# Patient Record
Sex: Female | Born: 1975 | Race: Black or African American | Hispanic: No | Marital: Single | Smoking: Never smoker
Health system: Southern US, Community
[De-identification: ages and names within clinical notes are randomized; demographics above are authoritative.]

## PROBLEM LIST (undated history)

## (undated) DIAGNOSIS — I10 Essential (primary) hypertension: Secondary | ICD-10-CM

---

## 2001-11-04 ENCOUNTER — Inpatient Hospital Stay (HOSPITAL_COMMUNITY): Admission: AD | Admit: 2001-11-04 | Discharge: 2001-11-04 | Payer: Self-pay | Admitting: Obstetrics

## 2002-01-17 ENCOUNTER — Inpatient Hospital Stay (HOSPITAL_COMMUNITY): Admission: AD | Admit: 2002-01-17 | Discharge: 2002-01-17 | Payer: Self-pay | Admitting: Obstetrics

## 2002-01-29 ENCOUNTER — Encounter: Payer: Self-pay | Admitting: Obstetrics

## 2002-01-29 ENCOUNTER — Inpatient Hospital Stay (HOSPITAL_COMMUNITY): Admission: AD | Admit: 2002-01-29 | Discharge: 2002-02-02 | Payer: Self-pay | Admitting: Obstetrics

## 2002-01-31 ENCOUNTER — Encounter (INDEPENDENT_AMBULATORY_CARE_PROVIDER_SITE_OTHER): Payer: Self-pay | Admitting: *Deleted

## 2008-07-27 ENCOUNTER — Ambulatory Visit (HOSPITAL_COMMUNITY): Admission: AD | Admit: 2008-07-27 | Discharge: 2008-07-27 | Payer: Self-pay | Admitting: Obstetrics & Gynecology

## 2008-07-27 ENCOUNTER — Ambulatory Visit: Payer: Self-pay | Admitting: Obstetrics & Gynecology

## 2008-07-27 ENCOUNTER — Encounter: Payer: Self-pay | Admitting: Obstetrics & Gynecology

## 2009-07-15 ENCOUNTER — Emergency Department (HOSPITAL_COMMUNITY): Admission: EM | Admit: 2009-07-15 | Discharge: 2009-07-15 | Payer: Self-pay | Admitting: Emergency Medicine

## 2010-07-30 LAB — CROSSMATCH
ABO/RH(D): O POS
Antibody Screen: NEGATIVE

## 2010-07-30 LAB — COMPREHENSIVE METABOLIC PANEL
ALT: 16 U/L (ref 0–35)
AST: 22 U/L (ref 0–37)
Albumin: 3.3 g/dL — ABNORMAL LOW (ref 3.5–5.2)
Alkaline Phosphatase: 69 U/L (ref 39–117)
BUN: 11 mg/dL (ref 6–23)
CO2: 21 mEq/L (ref 19–32)
Calcium: 8.1 mg/dL — ABNORMAL LOW (ref 8.4–10.5)
Chloride: 106 mEq/L (ref 96–112)
Creatinine, Ser: 0.76 mg/dL (ref 0.4–1.2)
GFR calc Af Amer: >60 mL/min (ref >60)
GFR calc non Af Amer: >60 mL/min (ref >60)
Glucose, Bld: 110 mg/dL — ABNORMAL HIGH (ref 70–99)
Potassium: 3.7 mEq/L (ref 3.5–5.1)
Sodium: 136 mEq/L (ref 135–145)
Total Bilirubin: 0.3 mg/dL (ref 0.3–1.2)
Total Protein: 6 g/dL (ref 6.0–8.3)

## 2010-07-30 LAB — CBC
HCT: 25.1 % — ABNORMAL LOW (ref 36.0–46.0)
HCT: 30.7 % — ABNORMAL LOW (ref 36.0–46.0)
Hemoglobin: 10.3 g/dL — ABNORMAL LOW (ref 12.0–15.0)
MCHC: 33.4 g/dL (ref 30.0–36.0)
MCV: 80.8 fL (ref 78.0–100.0)
Platelets: 204 10*3/uL (ref 150–400)
Platelets: 227 10*3/uL (ref 150–400)
RBC: 3.8 MIL/uL — ABNORMAL LOW (ref 3.87–5.11)
RDW: 15.6 % — ABNORMAL HIGH (ref 11.5–15.5)
RDW: 15.6 % — ABNORMAL HIGH (ref 11.5–15.5)
WBC: 11.5 10*3/uL — ABNORMAL HIGH (ref 4.0–10.5)

## 2010-07-30 LAB — DIFFERENTIAL
Basophils Absolute: 0 10*3/uL (ref 0.0–0.1)
Basophils Relative: 0 % (ref 0–1)
Eosinophils Absolute: 0.1 10*3/uL (ref 0.0–0.7)
Eosinophils Relative: 0 % (ref 0–5)
Lymphocytes Relative: 9 % — ABNORMAL LOW (ref 12–46)
Lymphs Abs: 1 10*3/uL (ref 0.7–4.0)
Monocytes Absolute: 0.4 10*3/uL (ref 0.1–1.0)
Monocytes Relative: 3 % (ref 3–12)
Neutro Abs: 10 10*3/uL — ABNORMAL HIGH (ref 1.7–7.7)
Neutrophils Relative %: 87 % — ABNORMAL HIGH (ref 43–77)

## 2010-07-30 LAB — ABO/RH
ABO/RH(D): O POS
ABO/RH(D): O POS

## 2010-08-21 ENCOUNTER — Other Ambulatory Visit (HOSPITAL_COMMUNITY): Payer: Self-pay | Admitting: Internal Medicine

## 2010-08-21 ENCOUNTER — Ambulatory Visit (HOSPITAL_COMMUNITY)
Admission: RE | Admit: 2010-08-21 | Discharge: 2010-08-21 | Disposition: A | Discharge status: Home / Self Care | Payer: Medicaid Other | Source: Ambulatory Visit | Attending: Internal Medicine | Admitting: Internal Medicine

## 2010-08-21 DIAGNOSIS — M25569 Pain in unspecified knee: Secondary | ICD-10-CM | POA: Insufficient documentation

## 2010-08-21 DIAGNOSIS — R52 Pain, unspecified: Secondary | ICD-10-CM

## 2010-09-02 NOTE — Op Note (Signed)
[name], [name] NO.:  [id number]   MEDICAL RECORD NO.:  [id number]          PATIENT TYPE:  AMB   LOCATION:  SDC                           FACILITY:  [facility]   PHYSICIAN:  Norton Blizzard, MD    DATE OF BIRTH:  1975-07-17   DATE OF PROCEDURE:  DATE OF DISCHARGE:                               OPERATIVE REPORT   PREOPERATIVE DIAGNOSES:  Intrauterine pregnancy at 5 and 5/7th weeks  gestation, incomplete medical abortion, syncope, heavy bleeding.   POSTOPERATIVE DIAGNOSES:  Intrauterine pregnancy at 5 and 5/7th weeks  gestation, incomplete medical abortion, syncope, heavy bleeding.   PROCEDURE:  Dilation and evacuation for surgical completion of failed  medical abortion.   SURGEON:  Norton Blizzard, MD   ANESTHESIA:  MAC and paracervical block.   IV FLUIDS:  1300 mL of lactated Ringer's.   URINE OUTPUT:  Minimal.   ESTIMATED BLOOD LOSS:  50 mL during the procedure.  Of note, the patient  lost a lot of blood during the examination in the MAU and was  orthostatic.   PROCEDURE INDICATIONS:  The patient is a 35 year old gravida 4, para 3-0-  1-3 status post medical abortion with misoprostol on July 26, 2008.  The  patient had heavy bleeding, was passing clots and continued to have  heavy bleeding when she went to work today and she had one episode of  syncope at her work place.  She was brought to the emergency room by  ambulance, and in the emergency room, the patient was examined and noted  to be orthostatic.  She also was noted to have an open cervical os and  heavy amount bleeding necessitating urgent dilation and evacuation for  her incomplete abortion.  Prior to surgery, the risks including but not  limited to bleeding which might require transfusion,  infection which  might require antibiotics, injury to uterus and surrounding organs, need  for additional procedures were explained to the patient and written  informed consent was obtained.   FINDINGS:   Nine-week sized anteverted uterus, moderate amount of  products of conception.   SPECIMENS:  Products of conception.   DISPOSITION OF SPECIMENS:  Pathology.   COMPLICATIONS:  None immediate.   DESCRIPTION OF PROCEDURE:  The patient was taken from the MAU to the  operating room.  She received Doxycycline 100 mg IV for prophylaxis.  While in the operating room, the patient was placed under monitored  anesthesia care, which was found to be adequate.  She was then placed in  the dorsal lithotomy position, and prepped and draped in a sterile  manner.  Her bladder was catheterized for an unmeasured amount of clear  yellow urine.  Attention was turned to the patient's pelvis where a  vaginal speculum was placed, and a tenaculum was placed on the anterior  lip of the cervix.  A paracervical block using 1% lidocaine and  epinephrine was placed, a total of 20 mL was used.  The cervix was noted  to be about 1 cm dilated and open, so the 8-mm curved curette was easily  passed into the fundus.  The suction machine was activated, suction was  created, and the curette was slowly rotated to clear the contents of the  uterus.  The uterus was noted to contract down after evacuation of  contents, and there was a moderate amount of products of conception  noted.  There was minimal blood loss during surgery, estimated to be  above 50 mL.  Sharp curettage was then done to confirm complete  evacuation of contents.  The tenaculum was then removed from the  anterior lip of the cervix and overall good hemostasis was noted.  All  instruments were removed from the patient's vagina.  The patient  tolerated the procedure well.  Sponge, instrument, and needle counts  were correct x2.  She was taken to the recovery room awake, and in  stable condition.   FOLLOWUP APPOINTMENT:  The patient has been scheduled for a followup  appointment on August 17, 2008, at 9:30 a.m. in the GYN Clinic here at  [facility].   She was given routine post D&E instructions and told  to come to MAU or the nearest emergency room for any postoperative  complications or concerns.      Norton Blizzard, MD  Electronically Signed     UAD/MEDQ  D:  07/27/2008  T:  07/28/2008  Job:  [id number]

## 2013-10-23 ENCOUNTER — Encounter (HOSPITAL_COMMUNITY): Payer: Self-pay | Admitting: Emergency Medicine

## 2013-10-23 ENCOUNTER — Emergency Department (HOSPITAL_COMMUNITY)
Admission: EM | Admit: 2013-10-23 | Discharge: 2013-10-23 | Disposition: A | Discharge status: Home / Self Care | Payer: No Typology Code available for payment source | Attending: Emergency Medicine | Admitting: Emergency Medicine

## 2013-10-23 DIAGNOSIS — Y9389 Activity, other specified: Secondary | ICD-10-CM | POA: Insufficient documentation

## 2013-10-23 DIAGNOSIS — Z79899 Other long term (current) drug therapy: Secondary | ICD-10-CM | POA: Insufficient documentation

## 2013-10-23 DIAGNOSIS — T148XXA Other injury of unspecified body region, initial encounter: Secondary | ICD-10-CM

## 2013-10-23 DIAGNOSIS — S199XXA Unspecified injury of neck, initial encounter: Secondary | ICD-10-CM

## 2013-10-23 DIAGNOSIS — IMO0002 Reserved for concepts with insufficient information to code with codable children: Secondary | ICD-10-CM | POA: Insufficient documentation

## 2013-10-23 DIAGNOSIS — S0993XA Unspecified injury of face, initial encounter: Secondary | ICD-10-CM | POA: Insufficient documentation

## 2013-10-23 DIAGNOSIS — Y92410 Unspecified street and highway as the place of occurrence of the external cause: Secondary | ICD-10-CM | POA: Insufficient documentation

## 2013-10-23 MED ORDER — IBUPROFEN 400 MG PO TABS
800.0000 mg | ORAL_TABLET | Freq: Once | ORAL | Status: AC
  Administered 2013-10-23: 800 mg via ORAL
  Filled 2013-10-23: qty 2

## 2013-10-23 MED ORDER — METHOCARBAMOL 500 MG PO TABS: 1000.0000 mg | ORAL_TABLET | Freq: Four times a day (QID) | ORAL | Status: AC

## 2013-10-23 MED ORDER — NAPROXEN 500 MG PO TABS: 500.0000 mg | ORAL_TABLET | Freq: Two times a day (BID) | ORAL | Status: AC

## 2013-10-23 NOTE — ED Provider Notes (Signed)
Medical screening examination/treatment/procedure(s) were performed by non-physician practitioner and as supervising physician I was immediately available for consultation/collaboration.   EKG Interpretation None        Enid SkeensJoshua M Cherilynn Schomburg, MD 10/23/13 1537
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
## 2016-01-16 ENCOUNTER — Emergency Department (HOSPITAL_COMMUNITY)
Admission: EM | Admit: 2016-01-16 | Discharge: 2016-01-16 | Disposition: A | Discharge status: Home / Self Care | Payer: Medicaid Other | Attending: Emergency Medicine | Admitting: Emergency Medicine

## 2016-01-16 DIAGNOSIS — R002 Palpitations: Secondary | ICD-10-CM | POA: Insufficient documentation

## 2016-01-16 LAB — BASIC METABOLIC PANEL
Anion gap: 8 (ref 5–15)
BUN: 6 mg/dL (ref 6–20)
CALCIUM: 9.2 mg/dL (ref 8.9–10.3)
CO2: 24 mmol/L (ref 22–32)
CREATININE: 0.86 mg/dL (ref 0.44–1.00)
Chloride: 106 mmol/L (ref 101–111)
GFR calc non Af Amer: >60 mL/min (ref >60)
Glucose, Bld: 98 mg/dL (ref 65–99)
Potassium: 3.4 mmol/L — ABNORMAL LOW (ref 3.5–5.1)
Sodium: 138 mmol/L (ref 135–145)

## 2016-01-16 LAB — CBC WITH DIFFERENTIAL/PLATELET
BASOS ABS: 0 10*3/uL (ref 0.0–0.1)
BASOS PCT: 0 %
EOS ABS: 0 10*3/uL (ref 0.0–0.7)
EOS PCT: 1 %
HCT: 39.4 % (ref 36.0–46.0)
HEMOGLOBIN: 13.5 g/dL (ref 12.0–15.0)
Lymphocytes Relative: 16 %
Lymphs Abs: 1 10*3/uL (ref 0.7–4.0)
MCH: 28.5 pg (ref 26.0–34.0)
MCHC: 34.3 g/dL (ref 30.0–36.0)
MCV: 83.3 fL (ref 78.0–100.0)
MONO ABS: 0.3 10*3/uL (ref 0.1–1.0)
MONOS PCT: 6 %
NEUTROS PCT: 77 %
Neutro Abs: 4.7 10*3/uL (ref 1.7–7.7)
Platelets: 251 10*3/uL (ref 150–400)
RBC: 4.73 MIL/uL (ref 3.87–5.11)
RDW: 12.9 % (ref 11.5–15.5)
WBC: 6.1 10*3/uL (ref 4.0–10.5)

## 2016-01-16 MED ORDER — SODIUM CHLORIDE 0.9 % IV BOLUS (SEPSIS)
500.0000 mL | Freq: Once | INTRAVENOUS | Status: AC
  Administered 2016-01-16: 500 mL via INTRAVENOUS

## 2016-01-16 NOTE — ED Notes (Signed)
Pt ambulated to bathroom 

## 2016-01-16 NOTE — ED Triage Notes (Signed)
Pt arrives via POV from home with heart palpitations since yesterday evening. States just started HCG and amphetamines for weightloss. Also noted her blood pressure was high at home. Pt reports very little food and water intake yesterday.

## 2016-01-16 NOTE — ED Notes (Signed)
Pt not in room.

## 2016-01-16 NOTE — Discharge Instructions (Signed)
Read the information below.  Your labs were re-assuring. Your EKG was re-assuring.  I encourage you to follow up with the bariatric office regarding your recent medication changes, these may be contributing to your symptoms.   Be sure to stay well hydrated and eat a balanced diet.  It is important to establish a primary care provider, I have provided the contact information for [facility] as well as additional resources to help with setting up a doctor.  You may return to the Emergency Department at any time for worsening condition or any new symptoms that concern you. Return to ED if you develop chest pain, shortness of breath, dizziness, or lightheadedness.

## 2016-01-16 NOTE — ED Provider Notes (Signed)
[facility]-EMERGENCY DEPT Provider Note   CSN: [id number] Arrival date & time: 01/16/16  0831     History   Chief Complaint Chief Complaint  Patient presents with  . Palpitations    HPI Destiny [name] is a 40 y.o. female.  Destiny MuirLatosha R [name] is a 40 y.o. female with no pertinent medical history presents to the ED with complaint of palpitations. Patient reports she started taking HCG and amphetamines on Tuesday for weight loss as prescribed by bariatric center. Yesterday she reports she only ate a bagel and drank one cup of coffee and a bottle of water. Last night she was checking her blood pressure and noted it to be elevated and she felt "anxious" and experienced "palpitations" described as her "heart racing," she was concerned prompting her to come to the ED. She currently denies feeling palpitations. She does endorse feeling dehydrated stating "my skin feels dry." She denies any fever, chest pain, shortness of breath, changes in vision, dizziness, lightheadedness, headache, numbness, weakness, abdominal pain, N/V.  She has not taken the medications today. No treatments tried PTA.       No past medical history on file.  There are no active problems to display for this patient.   No past surgical history on file.  OB History    No data available       Home Medications    Prior to Admission medications   Medication Sig Start Date End Date Taking? Authorizing Provider  Biotin 5000 MCG TABS Take 5,000 mcg by mouth daily.    Historical Provider, MD  loratadine (CLARITIN) 10 MG tablet Take 10 mg by mouth daily.    Historical Provider, MD  methocarbamol (ROBAXIN) 500 MG tablet Take 2 tablets (1,000 mg total) by mouth 4 (four) times daily. 10/23/13   Renne CriglerJoshua Geiple, PA-C  naproxen (NAPROSYN) 500 MG tablet Take 1 tablet (500 mg total) by mouth 2 (two) times daily. 10/23/13   Renne CriglerJoshua Geiple, PA-C    Family History No family history on file.  Social History Social History  Substance Use  Topics  . Smoking status: Never Smoker  . Smokeless tobacco: Not on file  . Alcohol use Yes     Allergies   Review of patient's allergies indicates no known allergies.   Review of Systems Review of Systems  Constitutional: Negative for chills, diaphoresis and fever.  HENT: Negative for trouble swallowing.   Eyes: Negative for visual disturbance.  Respiratory: Negative for shortness of breath.   Cardiovascular: Positive for palpitations (resolved). Negative for chest pain.  Gastrointestinal: Negative for abdominal pain, blood in stool, constipation, diarrhea, nausea and vomiting.  Genitourinary: Negative for dysuria and hematuria.  Musculoskeletal: Negative for neck pain.  Skin: Negative for rash.  Neurological: Negative for dizziness, syncope, light-headedness and headaches.     Physical Exam Updated Vital Signs BP 135/80 (BP Location: Right Arm)   Pulse 65   Resp 15   LMP 01/12/2016   SpO2 100%   Physical Exam  Constitutional: She appears well-developed and well-nourished. No distress.  HENT:  Head: Normocephalic and atraumatic.  Mouth/Throat: Oropharynx is clear and moist. No oropharyngeal exudate.  Eyes: Conjunctivae and EOM are normal. Pupils are equal, round, and reactive to light. Right eye exhibits no discharge. Left eye exhibits no discharge. No scleral icterus.  Neck: Normal range of motion and phonation normal. Neck supple. No neck rigidity. Normal range of motion present.  Cardiovascular: Normal rate, regular rhythm, normal heart sounds and intact distal pulses.  No murmur heard. Pulmonary/Chest: Effort normal and breath sounds normal. No stridor. No respiratory distress. She has no wheezes. She has no rales.  Abdominal: Soft. Bowel sounds are normal. She exhibits no distension. There is no tenderness. There is no rigidity, no rebound, no guarding and no CVA tenderness.  Musculoskeletal: Normal range of motion.  Lymphadenopathy:    She has no cervical  adenopathy.  Neurological: She is alert. She is not disoriented. Coordination and gait normal. GCS eye subscore is 4. GCS verbal subscore is 5. GCS motor subscore is 6.  Skin: Skin is warm and dry. She is not diaphoretic.  Psychiatric: She has a normal mood and affect. Her behavior is normal.     ED Treatments / Results  Labs (all labs ordered are listed, but only abnormal results are displayed) Labs Reviewed  BASIC METABOLIC PANEL - Abnormal; Notable for the following:       Result Value   Potassium 3.4 (*)    All other components within normal limits  CBC WITH DIFFERENTIAL/PLATELET    EKG  EKG Interpretation  Date/Time:  Thursday January 16 2016 08:37:52 EDT Ventricular Rate:  92 PR Interval:  152 QRS Duration: 86 QT Interval:  360 QTC Calculation: 445 R Axis:   82 Text Interpretation:  Normal sinus rhythm Nonspecific ST abnormality Otherwise normal ECG Confirmed by KNOTT MD, DANIEL ([id number]) on 01/16/2016 10:58:50 AM       Radiology No results found.  Procedures Procedures (including critical care time)  Medications Ordered in ED Medications  sodium chloride 0.9 % bolus 500 mL (0 mLs Intravenous Stopped 01/16/16 1242)     Initial Impression / Assessment and Plan / ED Course  I have reviewed the triage vital signs and the nursing notes.  Pertinent labs & imaging results that were available during my care of the patient were reviewed by me and considered in my medical decision making (see chart for details).  Clinical Course    Patient presents to ED with complaint of palpitations. Patient is afebrile and non-toxic appearing in NAD. VSS. Physical exam is re-assuring. IVF given. EKG shows sinus rhythm, normal intervals, no STEMI. Labs are re-assuring, doubt electrolyte abnormality as cause. No evidence of anemia. Suspect sxs may be related to ?recent new medications vs. ?anxiety vs. ?dehydration secondary to poor intake and new medications. Patient endorses  improvement following IVF hydration. Remains asx. Discussed results and plan with pt. Encouraged adequate hydration and balanced diet. Follow up with primary provider for re-evaluation. Also encouraged pt to call bariatric center regarding new medications and possible need for adjustment. Return precautions discussed. Patient voiced understanding and is agreeable.    Final Clinical Impressions(s) / ED Diagnoses   Final diagnoses:  Palpitations    New Prescriptions Discharge Medication List as of 01/16/2016  1:12 PM       Lona Kettle, PA-C 01/17/16 1610    Lyndal Pulley, MD 01/17/16 1807

## 2016-01-16 NOTE — ED Notes (Signed)
Pt ambulated to restroom. Pt tolerated well.  

## 2016-01-16 NOTE — ED Notes (Signed)
Pt states that she only drank "one regular bottle of water and a cup of coffee yesterday".

## 2019-03-31 ENCOUNTER — Other Ambulatory Visit: Payer: Self-pay

## 2019-03-31 ENCOUNTER — Emergency Department (HOSPITAL_COMMUNITY)
Admission: EM | Admit: 2019-03-31 | Discharge: 2019-04-01 | Disposition: A | Discharge status: Home / Self Care | Payer: Medicaid Other | Attending: Emergency Medicine | Admitting: Emergency Medicine

## 2019-03-31 ENCOUNTER — Encounter (HOSPITAL_COMMUNITY): Payer: Self-pay | Admitting: *Deleted

## 2019-03-31 DIAGNOSIS — Z79899 Other long term (current) drug therapy: Secondary | ICD-10-CM | POA: Diagnosis not present

## 2019-03-31 DIAGNOSIS — I10 Essential (primary) hypertension: Secondary | ICD-10-CM | POA: Diagnosis present

## 2019-03-31 HISTORY — DX: Essential (primary) hypertension: I10

## 2019-03-31 LAB — URINALYSIS, ROUTINE W REFLEX MICROSCOPIC
Bacteria, UA: NONE SEEN
Bilirubin Urine: NEGATIVE
Glucose, UA: NEGATIVE mg/dL
Ketones, ur: NEGATIVE mg/dL
Leukocytes,Ua: NEGATIVE
Nitrite: NEGATIVE
Protein, ur: NEGATIVE mg/dL
Specific Gravity, Urine: 1.008 (ref 1.005–1.030)
pH: 6 (ref 5.0–8.0)

## 2019-03-31 LAB — BASIC METABOLIC PANEL
Anion gap: 11 (ref 5–15)
BUN: 13 mg/dL (ref 6–20)
CO2: 23 mmol/L (ref 22–32)
Calcium: 8.9 mg/dL (ref 8.9–10.3)
Chloride: 105 mmol/L (ref 98–111)
Creatinine, Ser: 0.88 mg/dL (ref 0.44–1.00)
GFR calc Af Amer: >60 mL/min (ref >60)
GFR calc non Af Amer: >60 mL/min (ref >60)
Glucose, Bld: 118 mg/dL — ABNORMAL HIGH (ref 70–99)
Potassium: 3.4 mmol/L — ABNORMAL LOW (ref 3.5–5.1)
Sodium: 139 mmol/L (ref 135–145)

## 2019-03-31 LAB — CBC
HCT: 39.5 % (ref 36.0–46.0)
Hemoglobin: 13.3 g/dL (ref 12.0–15.0)
MCH: 29 pg (ref 26.0–34.0)
MCHC: 33.7 g/dL (ref 30.0–36.0)
MCV: 86.1 fL (ref 80.0–100.0)
Platelets: 319 10*3/uL (ref 150–400)
RBC: 4.59 MIL/uL (ref 3.87–5.11)
RDW: 13.1 % (ref 11.5–15.5)
WBC: 10.7 10*3/uL — ABNORMAL HIGH (ref 4.0–10.5)
nRBC: 0 % (ref 0.0–0.2)

## 2019-03-31 MED ORDER — SODIUM CHLORIDE 0.9% FLUSH: 3.0000 mL | Freq: Once | INTRAVENOUS | Status: DC

## 2019-03-31 NOTE — ED Triage Notes (Signed)
Pt says that she checked her blood pressure and it was 180/110, she denies any symptoms. States that she started taking Hydrochlorothiazide today. Pt states she feels like she is dehydrated, not drinking as much as normal.

## 2019-04-01 NOTE — ED Provider Notes (Signed)
TIME SEEN: 2:30 AM  CHIEF COMPLAINT: Hypertension  HPI: Patient is a 43 year old female with recent diagnosis of hypertension who presents to the emergency department with elevated blood pressure.  States she went to [facility] today for a routine checkup and they noted her blood pressure was elevated.  They started her on hydrochlorothiazide 12.5 mg daily.  She took 1 dose today.  States after she left the clinic she was checking her blood pressure regularly with a wrist cuff and states "I became paranoid" and wanted to come to the ED to get "checked out".  She denies any severe headache, vision changes, numbness, tingling, weakness, chest discomfort or tightness or pressure, shortness of breath.  States that she has not been seen by a doctor in years and is not sure what her blood pressure normally runs.  ROS: See HPI Constitutional: no fever  Eyes: no drainage  ENT: no runny nose   Cardiovascular:  no chest pain  Resp: no SOB  GI: no vomiting GU: no dysuria Integumentary: no rash  Allergy: no hives  Musculoskeletal: no leg swelling  Neurological: no slurred speech ROS otherwise negative  PAST MEDICAL HISTORY/PAST SURGICAL HISTORY:  Past Medical History:  Diagnosis Date  . Hypertension     MEDICATIONS:  Prior to Admission medications   Medication Sig Start Date End Date Taking? Authorizing Provider  Biotin 5000 MCG TABS Take 5,000 mcg by mouth daily.    [provider]  loratadine (CLARITIN) 10 MG tablet Take 10 mg by mouth daily.    [provider]  methocarbamol (ROBAXIN) 500 MG tablet Take 2 tablets (1,000 mg total) by mouth 4 (four) times daily. 10/23/13   Renne Crigler, PA-C  naproxen (NAPROSYN) 500 MG tablet Take 1 tablet (500 mg total) by mouth 2 (two) times daily. 10/23/13   Renne Crigler, PA-C    ALLERGIES:  No Known Allergies  SOCIAL HISTORY:  Social History   Tobacco Use  . Smoking status: Never Smoker  Substance Use Topics  . Alcohol  use: Yes    FAMILY HISTORY: No family history on file.  EXAM: BP (!) 178/94 (BP Location: Right Arm) Comment (BP Location): forearm  Pulse 83   Temp 98.4 F (36.9 C) (Oral)   Resp 16   SpO2 100%  CONSTITUTIONAL: Alert and oriented and responds appropriately to questions. Well-appearing; well-nourished, in no distress HEAD: Normocephalic EYES: Conjunctivae clear, pupils appear equal, EOM appear intact ENT: normal nose; moist mucous membranes NECK: Supple, normal ROM CARD: RRR; S1 and S2 appreciated; no murmurs, no clicks, no rubs, no gallops RESP: Normal chest excursion without splinting or tachypnea; breath sounds clear and equal bilaterally; no wheezes, no rhonchi, no rales, no hypoxia or respiratory distress, speaking full sentences ABD/GI: Normal bowel sounds; non-distended; soft, non-tender, no rebound, no guarding, no peritoneal signs, no hepatosplenomegaly BACK: Normal range of motion EXT: Normal ROM in all joints; no deformity noted, no edema; no cyanosis SKIN: Normal color for age and race; warm; no rash on exposed skin NEURO: Moves all extremities equally, normal speech PSYCH: The patient's mood and manner are appropriate.   MEDICAL DECISION MAKING: Patient here with asymptomatic hypertension.  Blood pressure currently 178/94.  Labs, EKG, urine obtained in triage show no acute abnormality other than hemoglobinuria.  No proteinuria or hematuria.  She is also concerned that she could be dehydrated eating that she feels she is not able to drink enough water.  Her labs are not hemoconcentrated and she has no ketones  in her urine to suggest dehydration.  Clinically she does not appear dry.  Have recommended she continue the HCTZ as prescribed and follow-up as scheduled with her PCP.  We discussed return precautions.  I do not feel she needs further emergent work-up given she is clinically well-appearing and asymptomatic.  I suspect that her blood pressure has been elevated for quite  some time.  Patient comfortable with plan.  At this time, I do not feel there is any life-threatening condition present. I have reviewed, interpreted and discussed all results (EKG, imaging, lab, urine as appropriate) and exam findings with patient/family. I have reviewed nursing notes and appropriate previous records.  I feel the patient is safe to be discharged home without further emergent workup and can continue workup as an outpatient as needed. Discussed usual and customary return precautions. Patient/family verbalize understanding and are comfortable with this plan.  Outpatient follow-up has been provided as needed. All questions have been answered.   Destiny [name] was evaluated in Emergency Department on 04/01/2019 for the symptoms described in the history of present illness. She was evaluated in the context of the global COVID-19 pandemic, which necessitated consideration that the patient might be at risk for infection with the SARS-CoV-2 virus that causes COVID-19. Institutional protocols and algorithms that pertain to the evaluation of patients at risk for COVID-19 are in a state of rapid change based on information released by regulatory bodies including the CDC and federal and state organizations. These policies and algorithms were followed during the patient's care in the ED.  Patient was seen wearing N95, face shield, gloves.  .   Date: 03/31/2019 21:19  Rate: 114  Rhythm: Sinus tachycardia  QRS Axis: normal  Intervals: normal  ST/T Wave abnormalities: normal  Conduction Disutrbances: none  Narrative Interpretation: Sinus tachycardia, motion artifact, no change compared to previous other than increased rate       Suttyn Cryder, Delice Bison, DO 04/01/19 0234

## 2019-04-01 NOTE — Discharge Instructions (Addendum)
Please continue your HCTZ 12.5 mg daily.  Please follow-up with your PCP as scheduled.

## 2019-05-18 ENCOUNTER — Emergency Department (HOSPITAL_COMMUNITY): Payer: Medicaid Other

## 2019-05-18 ENCOUNTER — Encounter (HOSPITAL_COMMUNITY): Payer: Self-pay | Admitting: Emergency Medicine

## 2019-05-18 ENCOUNTER — Emergency Department (HOSPITAL_COMMUNITY)
Admission: EM | Admit: 2019-05-18 | Discharge: 2019-05-18 | Disposition: A | Discharge status: Home / Self Care | Payer: Medicaid Other | Attending: Emergency Medicine | Admitting: Emergency Medicine

## 2019-05-18 DIAGNOSIS — Z79899 Other long term (current) drug therapy: Secondary | ICD-10-CM | POA: Diagnosis not present

## 2019-05-18 DIAGNOSIS — F418 Other specified anxiety disorders: Secondary | ICD-10-CM | POA: Insufficient documentation

## 2019-05-18 DIAGNOSIS — R079 Chest pain, unspecified: Secondary | ICD-10-CM | POA: Insufficient documentation

## 2019-05-18 DIAGNOSIS — I10 Essential (primary) hypertension: Secondary | ICD-10-CM | POA: Diagnosis present

## 2019-05-18 DIAGNOSIS — E876 Hypokalemia: Secondary | ICD-10-CM | POA: Insufficient documentation

## 2019-05-18 LAB — BASIC METABOLIC PANEL
Anion gap: 13 (ref 5–15)
BUN: 15 mg/dL (ref 6–20)
CO2: 21 mmol/L — ABNORMAL LOW (ref 22–32)
Calcium: 8.8 mg/dL — ABNORMAL LOW (ref 8.9–10.3)
Chloride: 104 mmol/L (ref 98–111)
Creatinine, Ser: 0.95 mg/dL (ref 0.44–1.00)
GFR calc Af Amer: >60 mL/min (ref >60)
GFR calc non Af Amer: >60 mL/min (ref >60)
Glucose, Bld: 116 mg/dL — ABNORMAL HIGH (ref 70–99)
Potassium: 2.7 mmol/L — CL (ref 3.5–5.1)
Sodium: 138 mmol/L (ref 135–145)

## 2019-05-18 LAB — CBC
HCT: 42.3 % (ref 36.0–46.0)
Hemoglobin: 14.3 g/dL (ref 12.0–15.0)
MCH: 28.8 pg (ref 26.0–34.0)
MCHC: 33.8 g/dL (ref 30.0–36.0)
MCV: 85.1 fL (ref 80.0–100.0)
Platelets: 317 10*3/uL (ref 150–400)
RBC: 4.97 MIL/uL (ref 3.87–5.11)
RDW: 12.5 % (ref 11.5–15.5)
WBC: 8 10*3/uL (ref 4.0–10.5)
nRBC: 0 % (ref 0.0–0.2)

## 2019-05-18 LAB — TROPONIN I (HIGH SENSITIVITY): Troponin I (High Sensitivity): 4 ng/L (ref <18)

## 2019-05-18 LAB — I-STAT BETA HCG BLOOD, ED (MC, WL, AP ONLY): I-stat hCG, quantitative: <5 m[IU]/mL (ref <5)

## 2019-05-18 MED ORDER — HYDROXYZINE HCL 25 MG PO TABS: 25.0000 mg | ORAL_TABLET | Freq: Three times a day (TID) | ORAL | 0 refills | Status: AC | PRN

## 2019-05-18 MED ORDER — POTASSIUM CHLORIDE CRYS ER 20 MEQ PO TBCR: 20.0000 meq | EXTENDED_RELEASE_TABLET | Freq: Two times a day (BID) | ORAL | 0 refills | Status: AC

## 2019-05-18 MED ORDER — SODIUM CHLORIDE 0.9% FLUSH: 3.0000 mL | Freq: Once | INTRAVENOUS | Status: DC

## 2019-05-18 NOTE — ED Notes (Signed)
Patient Alert and oriented to baseline. Stable and ambulatory to baseline. Patient verbalized understanding of the discharge instructions.  Patient belongings were taken by the patient.   

## 2019-05-18 NOTE — ED Provider Notes (Signed)
[facility] EMERGENCY DEPARTMENT Provider Note   CSN: [id number] Arrival date & time: 05/18/19  1018     History No chief complaint on file.   Destiny [name] is a 44 y.o. female.  HPI Patient presents with hypertension hypotension and chest pain.  States that she has recently been dealing with hypertension and that has had recent adjustment of her blood pressure medicines for the [facility] clinic.  Had been on hydrochlorothiazide now on amlodipine hydrochlorothiazide.  States last night she checked her blood pressure with her wrist cuff and it was around 130.  States that she kept rechecking it and was going down to was around 110.  Patient states she did get nervous about this.  States she has had some left-sided chest pain at times.  Location go to the arm.  Came on last night.  Not associate with exertion.  States she does get nervous when her blood pressure goes off.  States she is nervous because it was so high when she arrived here.  No fevers.  No swelling her legs.  Has had a cough.  Had a Covid test as an outpatient yesterday however.  Results should come back tomorrow.  States she did have contact with someone else who had contact with someone with Covid.    Past Medical History:  Diagnosis Date  . Hypertension     There are no problems to display for this patient.   History reviewed. No pertinent surgical history.   OB History   No obstetric history on file.     History reviewed. No pertinent family history.  Social History   Tobacco Use  . Smoking status: Never Smoker  . Smokeless tobacco: Never Used  Substance Use Topics  . Alcohol use: Yes  . Drug use: No    Home Medications Prior to Admission medications   Medication Sig Start Date End Date Taking? Authorizing Provider  Biotin 5000 MCG TABS Take 5,000 mcg by mouth daily.    [provider]  hydrOXYzine (ATARAX/VISTARIL) 25 MG tablet Take 1 tablet (25 mg total) by mouth every 8  (eight) hours as needed for anxiety. 05/18/19   Benjiman Core, MD  loratadine (CLARITIN) 10 MG tablet Take 10 mg by mouth daily.    [provider]  methocarbamol (ROBAXIN) 500 MG tablet Take 2 tablets (1,000 mg total) by mouth 4 (four) times daily. 10/23/13   Renne Crigler, PA-C  naproxen (NAPROSYN) 500 MG tablet Take 1 tablet (500 mg total) by mouth 2 (two) times daily. 10/23/13   Renne Crigler, PA-C  potassium chloride SA (KLOR-CON) 20 MEQ tablet Take 1 tablet (20 mEq total) by mouth 2 (two) times daily. 05/18/19   Benjiman Core, MD    Allergies    Patient has no known allergies.  Review of Systems   Review of Systems  Constitutional: Negative for appetite change.  HENT: Negative for congestion.   Respiratory: Positive for cough and shortness of breath.   Cardiovascular: Positive for chest pain.  Gastrointestinal: Negative for abdominal pain.  Genitourinary: Negative for flank pain.  Musculoskeletal: Negative for back pain.  Skin: Negative for rash.  Neurological: Negative for weakness.  Psychiatric/Behavioral: The patient is nervous/anxious.     Physical Exam Updated Vital Signs BP 120/78   Pulse 63   Temp 98.4 F (36.9 C) (Oral)   Resp 16   LMP 05/11/2019   SpO2 95%   Physical Exam Vitals and nursing note reviewed.  HENT:  Head: Normocephalic.  Eyes:     Extraocular Movements: Extraocular movements intact.  Cardiovascular:     Rate and Rhythm: Tachycardia present.  Pulmonary:     Breath sounds: No wheezing, rhonchi or rales.     Comments: Mild anterior chest wall tenderness without rebound or guarding. Abdominal:     Tenderness: There is no abdominal tenderness.  Musculoskeletal:     Right lower leg: No edema.     Left lower leg: No edema.  Skin:    General: Skin is warm.     Capillary Refill: Capillary refill takes less than 2 seconds.  Neurological:     Mental Status: She is alert and oriented to person, place, and time.     ED Results  / Procedures / Treatments   Labs (all labs ordered are listed, but only abnormal results are displayed) Labs Reviewed  BASIC METABOLIC PANEL - Abnormal; Notable for the following components:      Result Value   Potassium 2.7 (*)    CO2 21 (*)    Glucose, Bld 116 (*)    Calcium 8.8 (*)    All other components within normal limits  CBC  I-STAT BETA HCG BLOOD, ED ([facility], [facility], [facility] ONLY)  TROPONIN I (HIGH SENSITIVITY)    EKG EKG Interpretation  Date/Time:  Thursday May 18 2019 10:22:12 EST Ventricular Rate:  118 PR Interval:  150 QRS Duration: 74 QT Interval:  342 QTC Calculation: 479 R Axis:   87 Text Interpretation: Sinus tachycardia Nonspecific ST abnormality Abnormal ECG Confirmed by Davonna Belling [phone]) on 05/18/2019 10:40:58 AM   Radiology DG Chest 2 View  Result Date: 05/18/2019 CLINICAL DATA:  Chest pain EXAM: CHEST - 2 VIEW COMPARISON:  None. FINDINGS: The heart size and mediastinal contours are within normal limits. Both lungs are clear. The visualized skeletal structures are unremarkable. IMPRESSION: No acute abnormality of the lungs. Electronically Signed   By: Eddie Candle M.D.   On: 05/18/2019 11:19    Procedures Procedures (including critical care time)  Medications Ordered in ED Medications  sodium chloride flush (NS) 0.9 % injection 3 mL (3 mLs Intravenous Not Given 05/18/19 1047)    ED Course  I have reviewed the triage vital signs and the nursing notes.  Pertinent labs & imaging results that were available during my care of the patient were reviewed by me and considered in my medical decision making (see chart for details).    MDM Rules/Calculators/A&P                      Patient with chest pain.  Mild hypokalemia.  Also with anxiety.  EKG reassuring.  Doubt cardiac ischemia.  Had been on hydrochlorothiazide.  We will stop this due to the hypokalemia.  Will orally supplement.  Vistaril given for anxiety.  Discharge home with outpatient follow-up  Final Clinical Impression(s) / ED Diagnoses Final diagnoses:  Nonspecific chest pain  Hypokalemia  Anxiety about health    Rx / DC Orders ED Discharge Orders         Ordered    potassium chloride SA (KLOR-CON) 20 MEQ tablet  2 times daily     05/18/19 1223    hydrOXYzine (ATARAX/VISTARIL) 25 MG tablet  Every 8 hours PRN     05/18/19 1224           Davonna Belling, MD 05/18/19 1552

## 2019-05-18 NOTE — Discharge Instructions (Signed)
Stop your hydrochlorothiazide.  Keep your amlodipine going.  Follow-up with your doctor about further medication adjustments.  Your potassium was 2.7 today.

## 2019-05-18 NOTE — ED Triage Notes (Signed)
Pt here from home with c/o centralized chest pain that slightly radiates to the left arm , no sob/n/v pt just recently started new b/p meds

## 2020-08-22 IMAGING — CR DG CHEST 2V
2 series · 2 of 2 positions shown · non-contrast
Comparison: None.

CLINICAL DATA: Chest pain

EXAM:
CHEST - 2 VIEW

[chest pa]
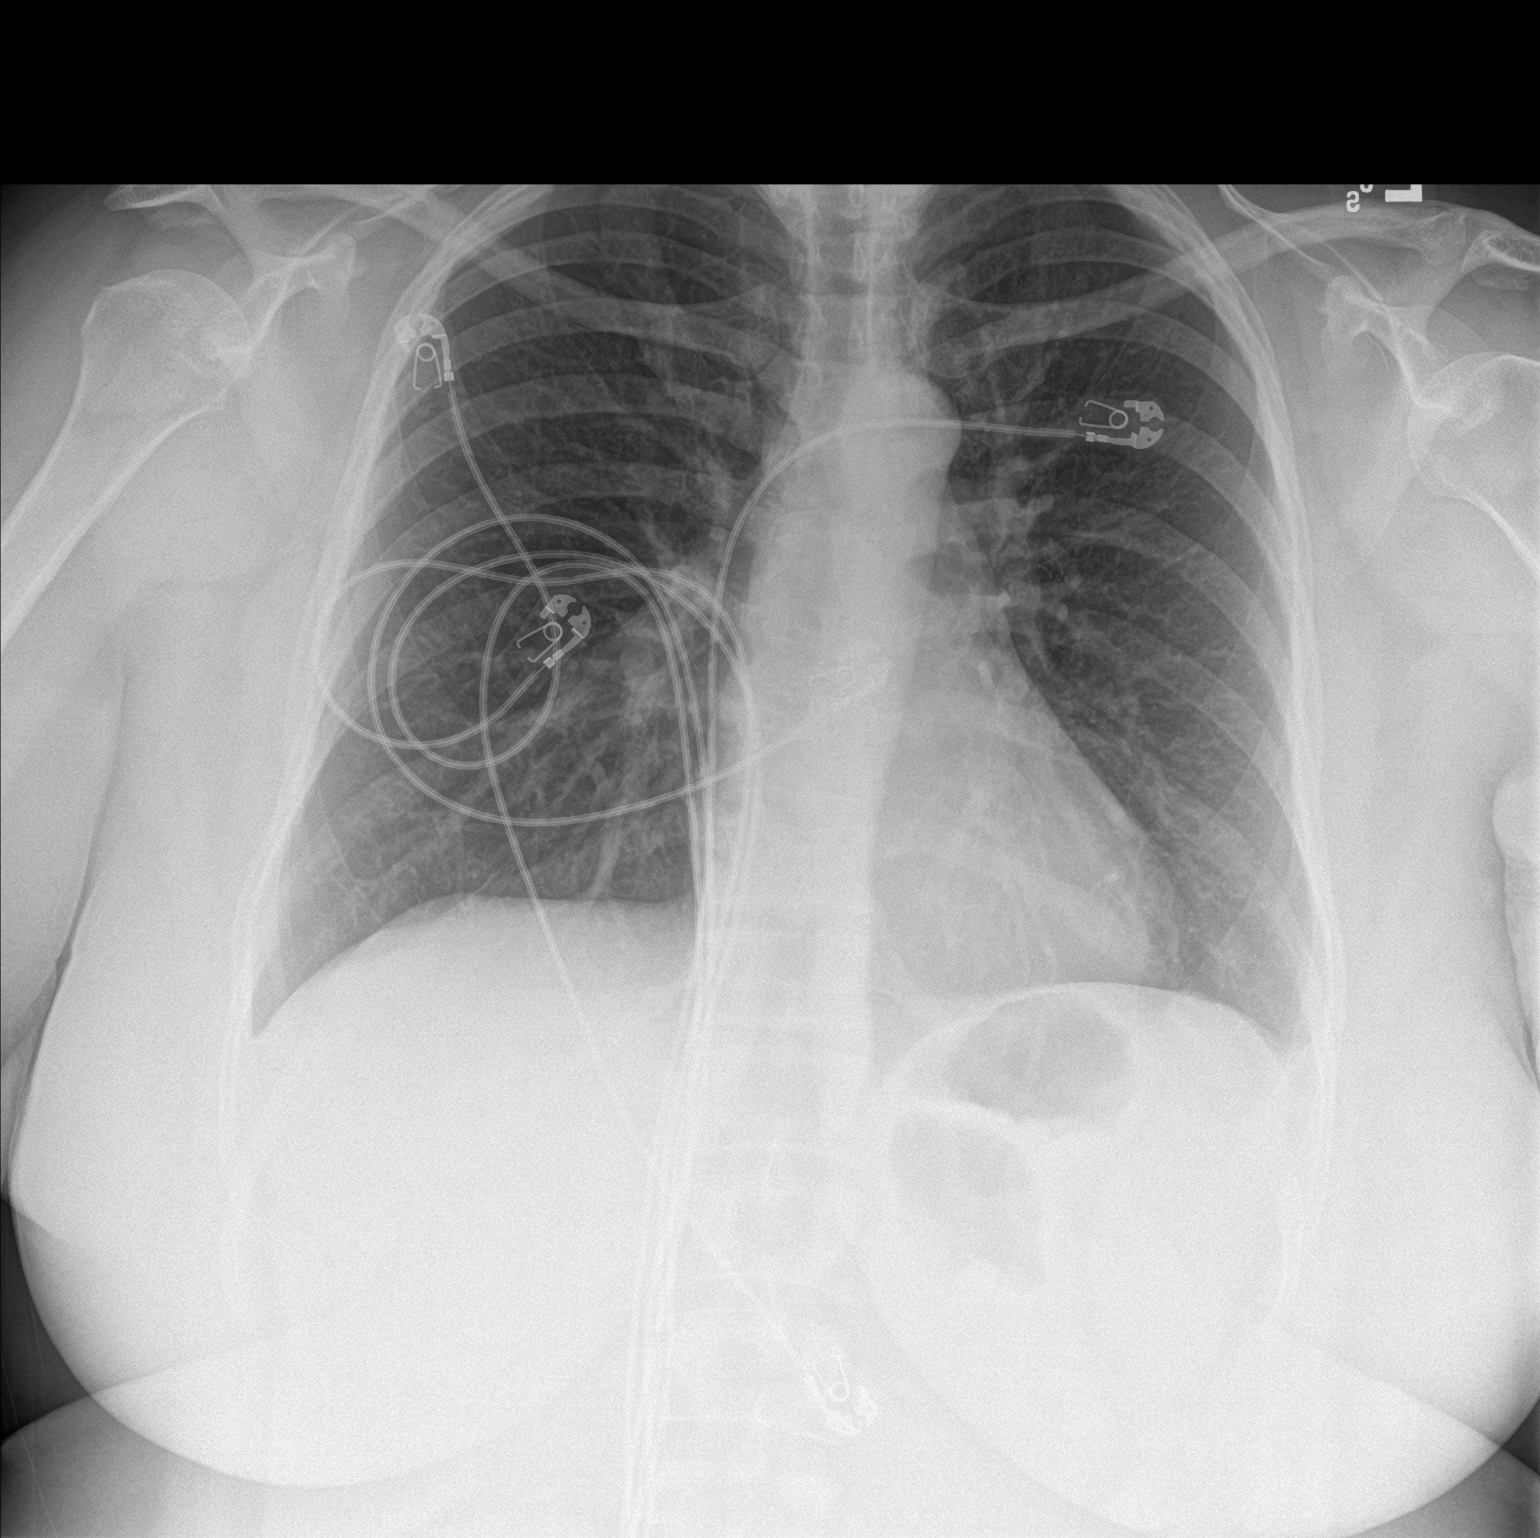

[chest lat]
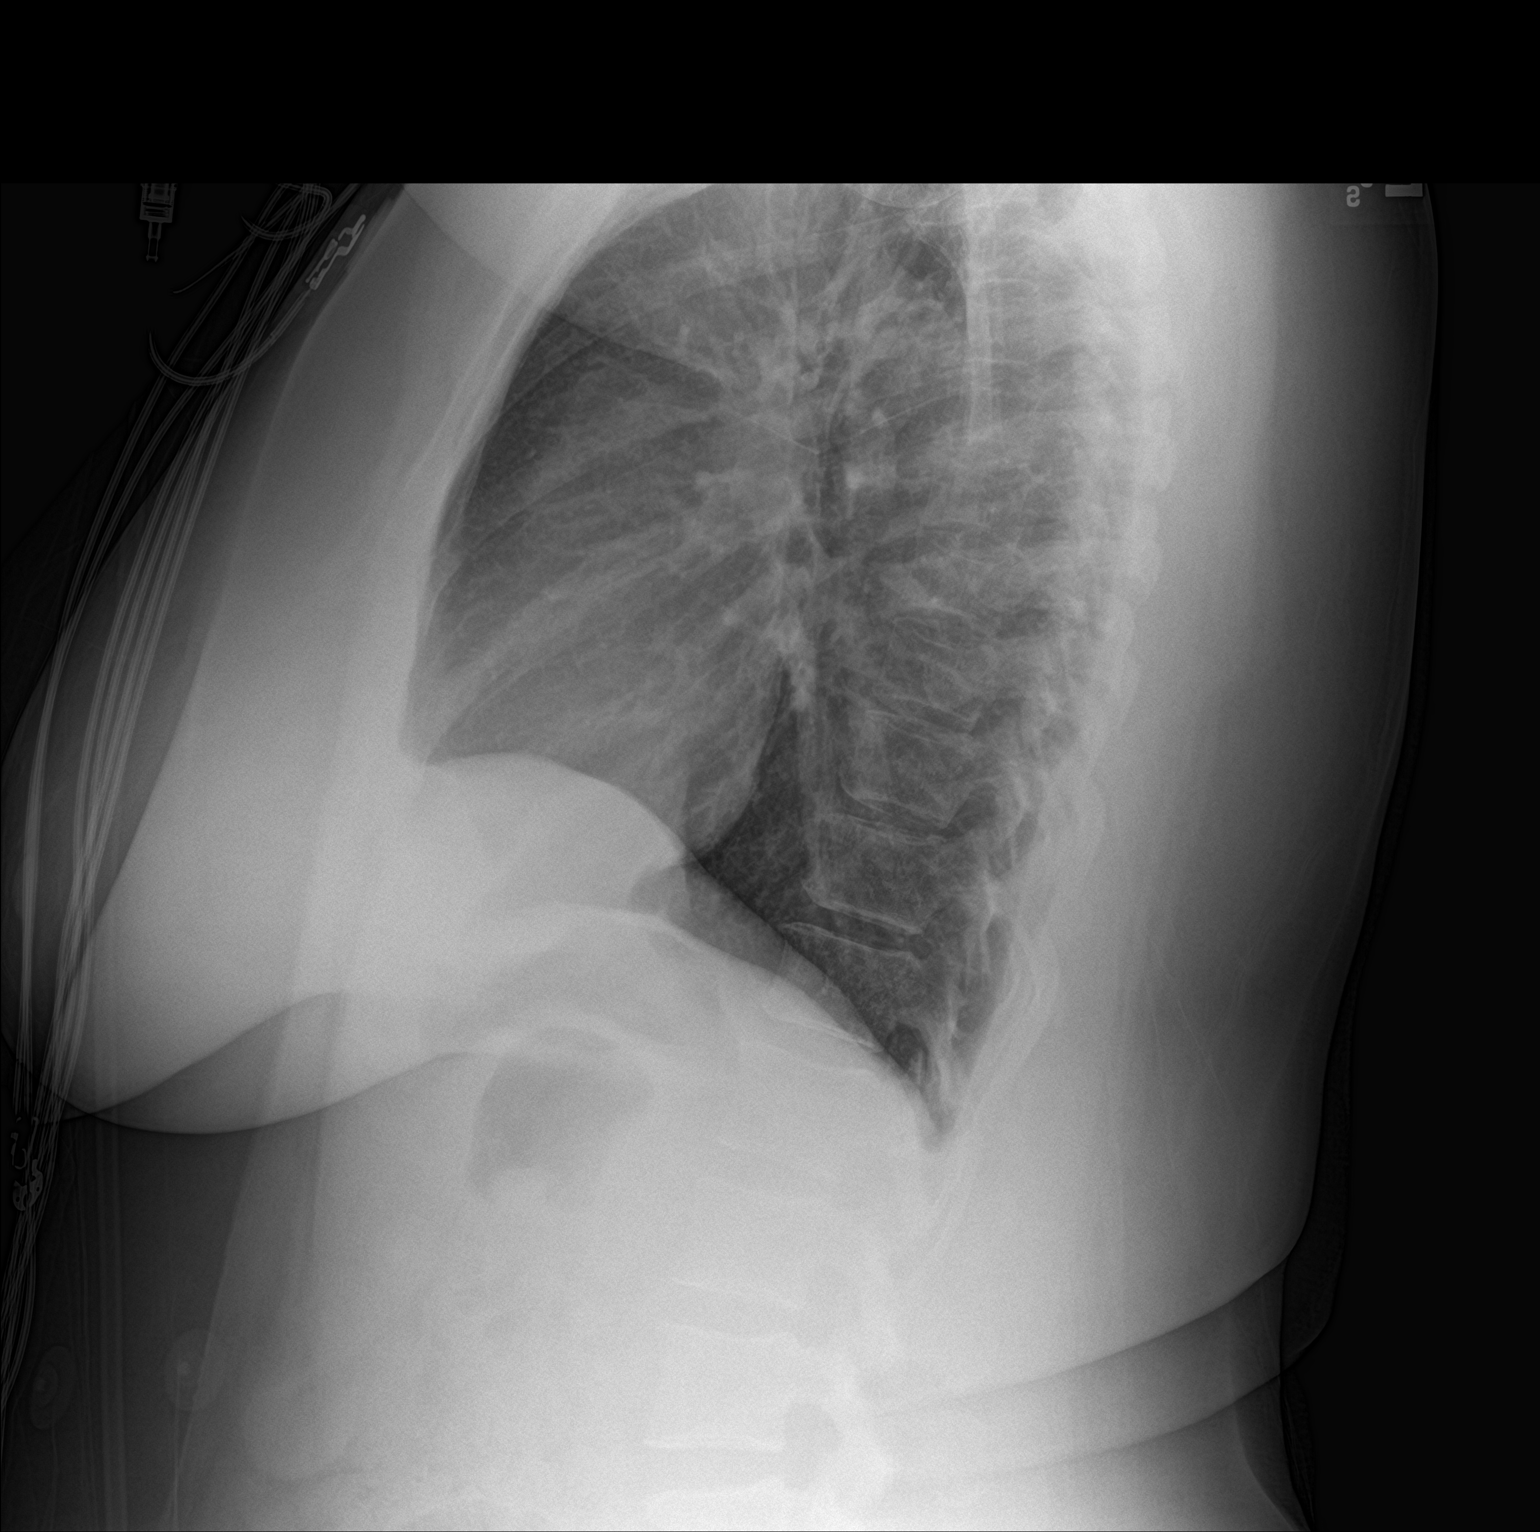

[2 of 2 positions shown; findings below may reference images not displayed]

FINDINGS: The heart size and mediastinal contours are within normal limits.
Both lungs are clear. The visualized skeletal structures are
unremarkable.
IMPRESSION: No acute abnormality of the lungs.

## 2020-09-24 ENCOUNTER — Ambulatory Visit: Payer: Medicaid Other | Admitting: Nurse Practitioner

## 2021-01-02 ENCOUNTER — Other Ambulatory Visit: Payer: Self-pay | Admitting: Family Medicine

## 2021-01-02 DIAGNOSIS — Z1231 Encounter for screening mammogram for malignant neoplasm of breast: Secondary | ICD-10-CM

## 2021-02-14 ENCOUNTER — Encounter: Payer: Medicaid Other | Admitting: Student

## 2021-03-25 ENCOUNTER — Telehealth: Payer: Self-pay

## 2022-08-02 ENCOUNTER — Other Ambulatory Visit: Payer: Self-pay

## 2022-08-02 ENCOUNTER — Emergency Department (HOSPITAL_COMMUNITY): Payer: Medicaid Other

## 2022-08-02 ENCOUNTER — Encounter (HOSPITAL_COMMUNITY): Payer: Self-pay | Admitting: Emergency Medicine

## 2022-08-02 ENCOUNTER — Emergency Department (HOSPITAL_COMMUNITY)
Admission: EM | Admit: 2022-08-02 | Discharge: 2022-08-03 | Discharge status: Against Medical Advice | Payer: Medicaid Other | Attending: Emergency Medicine | Admitting: Emergency Medicine

## 2022-08-02 DIAGNOSIS — M542 Cervicalgia: Secondary | ICD-10-CM | POA: Diagnosis not present

## 2022-08-02 DIAGNOSIS — Y92410 Unspecified street and highway as the place of occurrence of the external cause: Secondary | ICD-10-CM | POA: Diagnosis not present

## 2022-08-02 DIAGNOSIS — R519 Headache, unspecified: Secondary | ICD-10-CM | POA: Insufficient documentation

## 2022-08-02 DIAGNOSIS — Z5321 Procedure and treatment not carried out due to patient leaving prior to being seen by health care provider: Secondary | ICD-10-CM | POA: Insufficient documentation

## 2022-08-02 DIAGNOSIS — M549 Dorsalgia, unspecified: Secondary | ICD-10-CM | POA: Diagnosis not present

## 2022-08-02 LAB — I-STAT BETA HCG BLOOD, ED (MC, WL, AP ONLY): I-stat hCG, quantitative: <5 m[IU]/mL (ref <5)

## 2022-08-02 NOTE — ED Notes (Signed)
[place] collar applied on arrival to triage.
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
## 2022-08-02 NOTE — ED Notes (Signed)
Pt left the ed after telling registration she was doing so.

## 2022-08-04 ENCOUNTER — Telehealth: Payer: Medicaid Other | Admitting: Nurse Practitioner

## 2022-08-04 DIAGNOSIS — M549 Dorsalgia, unspecified: Secondary | ICD-10-CM

## 2022-08-04 NOTE — Progress Notes (Signed)
Based on what you shared with me, I feel your condition warrants further evaluation as soon as possible at an Emergency department.    NOTE: There will be NO CHARGE for this eVisit   If you are having a true medical emergency please call 911.      Emergency Department-[facility]  Get Driving Directions  [phone]  [address]  [place], [place] [address]  Open 24/7/365      [facility] Emergency Department at [facility]  Get Driving Directions  [address]  [place], [place] [address]  Open 24/7/365    Emergency Department- [facility]  Get Driving Directions  [phone]  [address]  [place], [place] [address]  Open 24/7/365      [facility]  Get Driving Directions  [phone]  [address]  [place], [place] [address]  Open 24/7/365    [facility]  Emergency Department- [facility]  Get Driving Directions  [phone]  [address]  [place], [place] [address]  Open 24/7/365    [place]  Emergency Department- [facility]  Get Driving Directions  [address]  [place], [place] [address]  Open 24/7/365    [facility]  Emergency Department- [facility]  Get Driving Directions  [phone]  [address]  [place], [place] [address]  Open 24/7/365   - left AMA from ED 2 days ago in [place]-col[place]
